# Patient Record
Sex: Male | Born: 1965 | Race: White | Hispanic: No | Marital: Married | Smoking: Never smoker
Health system: Southern US, Community
[De-identification: ages and names within clinical notes are randomized; demographics above are authoritative.]

## PROBLEM LIST (undated history)

## (undated) DIAGNOSIS — K219 Gastro-esophageal reflux disease without esophagitis: Secondary | ICD-10-CM

---

## 2016-04-20 ENCOUNTER — Other Ambulatory Visit (INDEPENDENT_AMBULATORY_CARE_PROVIDER_SITE_OTHER): Payer: Self-pay | Admitting: Physician Assistant

## 2016-04-27 NOTE — Pre-Procedure Instructions (Addendum)
    [facility] Munras Aveebert  04/27/2016      [facility] - [place] - [id number] [place] [id number] Phone: [phone] Fax: [phone]    Your procedure is scheduled on Tuesday, November 21.  [facility] Ad[facility] at 10:30 AM   Call this number if you have problems the morning of surgery: [phone]   Remember:  Do not eat food or drink liquids after midnight Monday, November 20.  Take these medicines the morning of surgery with A SIP OF WATER: None              1 Week prior to surgery STOP taking Aspirin, Aspirin Products (Goody Powder, Excedrin Migraine), Ibuprofen (Advil), Naproxen (Aleve), Vitamins and Herbal Products (ie Fish Oil)   Do not wear jewelry.  Do not wear lotions, powders, or perfumes, or deodorant.   Men may shave face and neck.  Do not bring valuables to the hospital.  [facility] is not responsible for any belongings or valuables.  Contacts, dentures or bridgework may not be worn into surgery.  Leave your suitcase in the car.  After surgery it may be brought to your room.  For patients admitted to the hospital, discharge time will be determined by your treatment team.  Patients discharged the day of surgery will not be allowed to drive home.   Special instructions: Review  [facility] - Preparing For Surgery.  Please read over the following fact sheets that you were given: [facility]- Preparing For Surgery and Patient Instructions for Mupirocin Application, Incentive Spirometry, Pain Booklet

## 2016-04-28 ENCOUNTER — Encounter (HOSPITAL_COMMUNITY): Payer: Self-pay

## 2016-04-28 ENCOUNTER — Encounter (HOSPITAL_COMMUNITY)
Admission: RE | Admit: 2016-04-28 | Discharge: 2016-04-28 | Disposition: A | Discharge status: Home / Self Care | Payer: BLUE CROSS/BLUE SHIELD | Source: Ambulatory Visit | Attending: Orthopaedic Surgery | Admitting: Orthopaedic Surgery

## 2016-04-28 DIAGNOSIS — Z01812 Encounter for preprocedural laboratory examination: Secondary | ICD-10-CM | POA: Diagnosis not present

## 2016-04-28 DIAGNOSIS — M1611 Unilateral primary osteoarthritis, right hip: Secondary | ICD-10-CM | POA: Insufficient documentation

## 2016-04-28 HISTORY — DX: Gastro-esophageal reflux disease without esophagitis: K21.9

## 2016-04-28 LAB — CBC
HCT: 43.9 % (ref 39.0–52.0)
HEMOGLOBIN: 14.8 g/dL (ref 13.0–17.0)
MCH: 30.6 pg (ref 26.0–34.0)
MCHC: 33.7 g/dL (ref 30.0–36.0)
MCV: 90.7 fL (ref 78.0–100.0)
Platelets: 223 10*3/uL (ref 150–400)
RBC: 4.84 MIL/uL (ref 4.22–5.81)
RDW: 12.2 % (ref 11.5–15.5)
WBC: 5.5 10*3/uL (ref 4.0–10.5)

## 2016-04-28 LAB — SURGICAL PCR SCREEN
MRSA, PCR: NEGATIVE
Staphylococcus aureus: POSITIVE — AB

## 2016-04-28 NOTE — Progress Notes (Addendum)
PCP: Dr. Susa LofflerMark Spears in [place], [place]  Pt. Notified of positive PCR. Prescription called to [facility].

## 2016-05-04 MED ORDER — CLINDAMYCIN PHOSPHATE 900 MG/50ML IV SOLN
900.0000 mg | INTRAVENOUS | Status: AC
  Administered 2016-05-05: 900 mg via INTRAVENOUS
  Filled 2016-05-04: qty 50

## 2016-05-04 MED ORDER — TRANEXAMIC ACID 1000 MG/10ML IV SOLN
1000.0000 mg | INTRAVENOUS | Status: AC
  Administered 2016-05-05: 1000 mg via INTRAVENOUS
  Filled 2016-05-04: qty 10

## 2016-05-05 ENCOUNTER — Encounter (HOSPITAL_COMMUNITY): Payer: Self-pay | Admitting: Surgery

## 2016-05-05 ENCOUNTER — Inpatient Hospital Stay (HOSPITAL_COMMUNITY): Payer: BLUE CROSS/BLUE SHIELD | Admitting: Anesthesiology

## 2016-05-05 ENCOUNTER — Inpatient Hospital Stay (HOSPITAL_COMMUNITY)
Admission: RE | Admit: 2016-05-05 | Discharge: 2016-05-07 | DRG: 470 | Disposition: A | Discharge status: Home Health | Payer: BLUE CROSS/BLUE SHIELD | Source: Ambulatory Visit | Attending: Orthopaedic Surgery | Admitting: Orthopaedic Surgery

## 2016-05-05 ENCOUNTER — Inpatient Hospital Stay (HOSPITAL_COMMUNITY): Payer: BLUE CROSS/BLUE SHIELD

## 2016-05-05 ENCOUNTER — Encounter (HOSPITAL_COMMUNITY)
Admission: RE | Disposition: A | Discharge status: Home / Self Care | Payer: Self-pay | Source: Ambulatory Visit | Attending: Orthopaedic Surgery

## 2016-05-05 DIAGNOSIS — M1611 Unilateral primary osteoarthritis, right hip: Secondary | ICD-10-CM | POA: Diagnosis not present

## 2016-05-05 DIAGNOSIS — Z96641 Presence of right artificial hip joint: Secondary | ICD-10-CM

## 2016-05-05 DIAGNOSIS — Z419 Encounter for procedure for purposes other than remedying health state, unspecified: Secondary | ICD-10-CM

## 2016-05-05 DIAGNOSIS — Z880 Allergy status to penicillin: Secondary | ICD-10-CM

## 2016-05-05 HISTORY — PX: TOTAL HIP ARTHROPLASTY: SHX124

## 2016-05-05 SURGERY — ARTHROPLASTY, HIP, TOTAL, ANTERIOR APPROACH
Anesthesia: Spinal | Site: Hip | Laterality: Right

## 2016-05-05 MED ORDER — DIPHENHYDRAMINE HCL 12.5 MG/5ML PO ELIX: 12.5000 mg | ORAL_SOLUTION | ORAL | Status: DC | PRN

## 2016-05-05 MED ORDER — LACTATED RINGERS IV SOLN
INTRAVENOUS | Status: DC | PRN
  Administered 2016-05-05 (×2): via INTRAVENOUS

## 2016-05-05 MED ORDER — ONDANSETRON HCL 4 MG PO TABS: 4.0000 mg | ORAL_TABLET | Freq: Four times a day (QID) | ORAL | Status: DC | PRN

## 2016-05-05 MED ORDER — ACETAMINOPHEN 650 MG RE SUPP: 650.0000 mg | Freq: Four times a day (QID) | RECTAL | Status: DC | PRN

## 2016-05-05 MED ORDER — HYDROMORPHONE HCL 1 MG/ML IJ SOLN
INTRAMUSCULAR | Status: AC
  Filled 2016-05-05: qty 0.5

## 2016-05-05 MED ORDER — 0.9 % SODIUM CHLORIDE (POUR BTL) OPTIME
TOPICAL | Status: DC | PRN
  Administered 2016-05-05: 1000 mL

## 2016-05-05 MED ORDER — HYDROMORPHONE HCL 2 MG/ML IJ SOLN: 1.0000 mg | INTRAMUSCULAR | Status: DC | PRN

## 2016-05-05 MED ORDER — ASPIRIN 81 MG PO CHEW
81.0000 mg | CHEWABLE_TABLET | Freq: Two times a day (BID) | ORAL | Status: DC
  Administered 2016-05-06 – 2016-05-07 (×3): 81 mg via ORAL
  Filled 2016-05-05 (×4): qty 1

## 2016-05-05 MED ORDER — LACTATED RINGERS IV SOLN
INTRAVENOUS | Status: DC
  Administered 2016-05-05: 11:00:00 via INTRAVENOUS

## 2016-05-05 MED ORDER — OXYCODONE HCL 5 MG/5ML PO SOLN: 5.0000 mg | Freq: Once | ORAL | Status: DC | PRN

## 2016-05-05 MED ORDER — HYDROMORPHONE HCL 1 MG/ML IJ SOLN: 0.2500 mg | INTRAMUSCULAR | Status: DC | PRN

## 2016-05-05 MED ORDER — FENTANYL CITRATE (PF) 100 MCG/2ML IJ SOLN
INTRAMUSCULAR | Status: DC | PRN
  Administered 2016-05-05: 100 ug via INTRAVENOUS

## 2016-05-05 MED ORDER — PROPOFOL 10 MG/ML IV BOLUS
INTRAVENOUS | Status: AC
Start: 2016-05-05 — End: 2016-05-05
  Filled 2016-05-05: qty 20

## 2016-05-05 MED ORDER — POLYETHYLENE GLYCOL 3350 17 G PO PACK: 17.0000 g | PACK | Freq: Every day | ORAL | Status: DC | PRN

## 2016-05-05 MED ORDER — DOCUSATE SODIUM 100 MG PO CAPS
100.0000 mg | ORAL_CAPSULE | Freq: Two times a day (BID) | ORAL | Status: DC
  Administered 2016-05-05 – 2016-05-07 (×4): 100 mg via ORAL
  Filled 2016-05-05 (×4): qty 1

## 2016-05-05 MED ORDER — METHOCARBAMOL 500 MG PO TABS
500.0000 mg | ORAL_TABLET | Freq: Four times a day (QID) | ORAL | Status: DC | PRN
  Administered 2016-05-05 – 2016-05-07 (×6): 500 mg via ORAL
  Filled 2016-05-05 (×6): qty 1

## 2016-05-05 MED ORDER — ALUM & MAG HYDROXIDE-SIMETH 200-200-20 MG/5ML PO SUSP: 30.0000 mL | ORAL | Status: DC | PRN

## 2016-05-05 MED ORDER — METHOCARBAMOL 1000 MG/10ML IJ SOLN: 500.0000 mg | Freq: Four times a day (QID) | INTRAVENOUS | Status: DC | PRN

## 2016-05-05 MED ORDER — PROPOFOL 500 MG/50ML IV EMUL
INTRAVENOUS | Status: DC | PRN
  Administered 2016-05-05: 100 ug/kg/min via INTRAVENOUS

## 2016-05-05 MED ORDER — OXYCODONE HCL 5 MG PO TABS: 5.0000 mg | ORAL_TABLET | Freq: Once | ORAL | Status: DC | PRN

## 2016-05-05 MED ORDER — ZOLPIDEM TARTRATE 5 MG PO TABS: 5.0000 mg | ORAL_TABLET | Freq: Every evening | ORAL | Status: DC | PRN

## 2016-05-05 MED ORDER — PHENOL 1.4 % MT LIQD: 1.0000 | OROMUCOSAL | Status: DC | PRN

## 2016-05-05 MED ORDER — GABAPENTIN 100 MG PO CAPS
100.0000 mg | ORAL_CAPSULE | Freq: Three times a day (TID) | ORAL | Status: DC
  Administered 2016-05-05 – 2016-05-07 (×5): 100 mg via ORAL
  Filled 2016-05-05 (×5): qty 1

## 2016-05-05 MED ORDER — CLINDAMYCIN PHOSPHATE 600 MG/50ML IV SOLN
600.0000 mg | Freq: Four times a day (QID) | INTRAVENOUS | Status: AC
  Administered 2016-05-05 – 2016-05-06 (×2): 600 mg via INTRAVENOUS
  Filled 2016-05-05 (×2): qty 50

## 2016-05-05 MED ORDER — ALBUMIN HUMAN 5 % IV SOLN
INTRAVENOUS | Status: DC | PRN
  Administered 2016-05-05: 16:00:00 via INTRAVENOUS

## 2016-05-05 MED ORDER — METOCLOPRAMIDE HCL 5 MG PO TABS: 5.0000 mg | ORAL_TABLET | Freq: Three times a day (TID) | ORAL | Status: DC | PRN

## 2016-05-05 MED ORDER — HYDROMORPHONE HCL 1 MG/ML IJ SOLN
0.2500 mg | INTRAMUSCULAR | Status: DC | PRN
  Administered 2016-05-05: 0.5 mg via INTRAVENOUS

## 2016-05-05 MED ORDER — ONDANSETRON HCL 4 MG/2ML IJ SOLN
4.0000 mg | Freq: Four times a day (QID) | INTRAMUSCULAR | Status: DC | PRN
Start: 2016-05-05 — End: 2016-05-07

## 2016-05-05 MED ORDER — PROPOFOL 500 MG/50ML IV EMUL: INTRAVENOUS | Status: DC | PRN

## 2016-05-05 MED ORDER — BUPIVACAINE HCL (PF) 0.5 % IJ SOLN
INTRAMUSCULAR | Status: DC | PRN
  Administered 2016-05-05: 3 mL via INTRATHECAL

## 2016-05-05 MED ORDER — OXYCODONE HCL 5 MG PO TABS
5.0000 mg | ORAL_TABLET | ORAL | Status: DC | PRN
  Administered 2016-05-05 – 2016-05-06 (×4): 10 mg via ORAL
  Administered 2016-05-06: 5 mg via ORAL
  Administered 2016-05-06 (×2): 10 mg via ORAL
  Administered 2016-05-07 (×3): 5 mg via ORAL
  Filled 2016-05-05 (×2): qty 1
  Filled 2016-05-05: qty 2
  Filled 2016-05-05: qty 1
  Filled 2016-05-05 (×2): qty 2
  Filled 2016-05-05: qty 1
  Filled 2016-05-05 (×2): qty 2
  Filled 2016-05-05 (×2): qty 1

## 2016-05-05 MED ORDER — CHLORHEXIDINE GLUCONATE 4 % EX LIQD: 60.0000 mL | Freq: Once | CUTANEOUS | Status: DC

## 2016-05-05 MED ORDER — ACETAMINOPHEN 325 MG PO TABS: 650.0000 mg | ORAL_TABLET | Freq: Four times a day (QID) | ORAL | Status: DC | PRN

## 2016-05-05 MED ORDER — SODIUM CHLORIDE 0.9 % IR SOLN
Status: DC | PRN
  Administered 2016-05-05: 3000 mL

## 2016-05-05 MED ORDER — SODIUM CHLORIDE 0.9 % IV SOLN
INTRAVENOUS | Status: DC
  Administered 2016-05-05: 21:00:00 via INTRAVENOUS

## 2016-05-05 MED ORDER — MIDAZOLAM HCL 2 MG/2ML IJ SOLN
INTRAMUSCULAR | Status: AC
  Filled 2016-05-05: qty 2

## 2016-05-05 MED ORDER — FENTANYL CITRATE (PF) 100 MCG/2ML IJ SOLN
INTRAMUSCULAR | Status: AC
  Filled 2016-05-05: qty 4

## 2016-05-05 MED ORDER — PHENYLEPHRINE HCL 10 MG/ML IJ SOLN
INTRAMUSCULAR | Status: DC | PRN
  Administered 2016-05-05 (×4): 80 ug via INTRAVENOUS

## 2016-05-05 MED ORDER — METOCLOPRAMIDE HCL 5 MG/ML IJ SOLN: 5.0000 mg | Freq: Three times a day (TID) | INTRAMUSCULAR | Status: DC | PRN

## 2016-05-05 MED ORDER — MIDAZOLAM HCL 2 MG/2ML IJ SOLN
INTRAMUSCULAR | Status: DC | PRN
  Administered 2016-05-05: 2 mg via INTRAVENOUS

## 2016-05-05 MED ORDER — PROMETHAZINE HCL 25 MG/ML IJ SOLN: 6.2500 mg | INTRAMUSCULAR | Status: DC | PRN

## 2016-05-05 MED ORDER — MENTHOL 3 MG MT LOZG: 1.0000 | LOZENGE | OROMUCOSAL | Status: DC | PRN

## 2016-05-05 SURGICAL SUPPLY — 55 items
BENZOIN TINCTURE PRP APPL 2/3 (GAUZE/BANDAGES/DRESSINGS) ×3 IMPLANT
BLADE SAW SGTL 18X1.27X75 (BLADE) ×2 IMPLANT
BLADE SAW SGTL 18X1.27X75MM (BLADE) ×1
BLADE SURG ROTATE 9660 (MISCELLANEOUS) IMPLANT
CAPT HIP TOTAL 2 ×3 IMPLANT
CELLS DAT CNTRL 66122 CELL SVR (MISCELLANEOUS) ×1 IMPLANT
CLOSURE STERI-STRIP 1/2X4 (GAUZE/BANDAGES/DRESSINGS) ×1
CLOSURE WOUND 1/2 X4 (GAUZE/BANDAGES/DRESSINGS) ×2
CLSR STERI-STRIP ANTIMIC 1/2X4 (GAUZE/BANDAGES/DRESSINGS) ×2 IMPLANT
COVER SURGICAL LIGHT HANDLE (MISCELLANEOUS) ×3 IMPLANT
DRAPE C-ARM 42X72 X-RAY (DRAPES) ×3 IMPLANT
DRAPE STERI IOBAN 125X83 (DRAPES) ×3 IMPLANT
DRAPE U-SHAPE 47X51 STRL (DRAPES) ×9 IMPLANT
DRSG AQUACEL AG ADV 3.5X10 (GAUZE/BANDAGES/DRESSINGS) ×3 IMPLANT
DURAPREP 26ML APPLICATOR (WOUND CARE) ×3 IMPLANT
ELECT BLADE 4.0 EZ CLEAN MEGAD (MISCELLANEOUS) ×3
ELECT BLADE 6.5 EXT (BLADE) IMPLANT
ELECT REM PT RETURN 9FT ADLT (ELECTROSURGICAL) ×3
ELECTRODE BLDE 4.0 EZ CLN MEGD (MISCELLANEOUS) ×1 IMPLANT
ELECTRODE REM PT RTRN 9FT ADLT (ELECTROSURGICAL) ×1 IMPLANT
FACESHIELD WRAPAROUND (MASK) ×6 IMPLANT
GLOVE BIOGEL PI IND STRL 8 (GLOVE) ×2 IMPLANT
GLOVE BIOGEL PI INDICATOR 8 (GLOVE) ×4
GLOVE ECLIPSE 8.0 STRL XLNG CF (GLOVE) ×3 IMPLANT
GLOVE ORTHO TXT STRL SZ7.5 (GLOVE) ×6 IMPLANT
GOWN STRL REUS W/ TWL LRG LVL3 (GOWN DISPOSABLE) ×2 IMPLANT
GOWN STRL REUS W/ TWL XL LVL3 (GOWN DISPOSABLE) ×2 IMPLANT
GOWN STRL REUS W/TWL LRG LVL3 (GOWN DISPOSABLE) ×4
GOWN STRL REUS W/TWL XL LVL3 (GOWN DISPOSABLE) ×4
HANDPIECE INTERPULSE COAX TIP (DISPOSABLE) ×2
HEAD CERAMIC 36 PLUS5 (Hips) IMPLANT
HEAD CERAMIC DELTA 36 PLUS 1.5 (Hips) IMPLANT
KIT BASIN OR (CUSTOM PROCEDURE TRAY) ×3 IMPLANT
KIT ROOM TURNOVER OR (KITS) ×3 IMPLANT
MANIFOLD NEPTUNE II (INSTRUMENTS) ×3 IMPLANT
NS IRRIG 1000ML POUR BTL (IV SOLUTION) ×3 IMPLANT
PACK TOTAL JOINT (CUSTOM PROCEDURE TRAY) ×3 IMPLANT
PAD ARMBOARD 7.5X6 YLW CONV (MISCELLANEOUS) ×3 IMPLANT
RTRCTR WOUND ALEXIS 18CM MED (MISCELLANEOUS) ×3
SET HNDPC FAN SPRY TIP SCT (DISPOSABLE) ×1 IMPLANT
STAPLER VISISTAT 35W (STAPLE) IMPLANT
STRIP CLOSURE SKIN 1/2X4 (GAUZE/BANDAGES/DRESSINGS) ×4 IMPLANT
SUT ETHIBOND NAB CT1 #1 30IN (SUTURE) ×3 IMPLANT
SUT MNCRL AB 4-0 PS2 18 (SUTURE) IMPLANT
SUT VIC AB 0 CT1 27 (SUTURE) ×2
SUT VIC AB 0 CT1 27XBRD ANBCTR (SUTURE) ×1 IMPLANT
SUT VIC AB 1 CT1 27 (SUTURE) ×2
SUT VIC AB 1 CT1 27XBRD ANBCTR (SUTURE) ×1 IMPLANT
SUT VIC AB 2-0 CT1 27 (SUTURE) ×2
SUT VIC AB 2-0 CT1 TAPERPNT 27 (SUTURE) ×1 IMPLANT
TOWEL OR 17X24 6PK STRL BLUE (TOWEL DISPOSABLE) ×3 IMPLANT
TOWEL OR 17X26 10 PK STRL BLUE (TOWEL DISPOSABLE) ×3 IMPLANT
TRAY CATH 16FR W/PLASTIC CATH (SET/KITS/TRAYS/PACK) ×3 IMPLANT
TRAY FOLEY CATH 16FRSI W/METER (SET/KITS/TRAYS/PACK) IMPLANT
WATER STERILE IRR 1000ML POUR (IV SOLUTION) ×6 IMPLANT

## 2016-05-05 NOTE — Brief Op Note (Signed)
05/05/2016  3:33 PM  PATIENT:  Marisa Cyphersichard Darrough  50 y.o. male  PRE-OPERATIVE DIAGNOSIS:  Severe osteoarthritis right hip  POST-OPERATIVE DIAGNOSIS:  Severe osteoarthritis right hip  PROCEDURE:  Procedure(s): RIGHT TOTAL HIP ARTHROPLASTY ANTERIOR APPROACH (Right)  SURGEON:  Surgeon(s) and Role:    * Kathryne Hitchhristopher Y Blackman, MD - Primary  PHYSICIAN ASSISTANT: Rexene EdisonGil Clark, PA-C  ANESTHESIA:   spinal  EBL:  Total I/O In: 2500 [I.V.:2500] Out: 700 [Blood:700]  COUNTS:  YES  PLAN OF CARE: Admit to inpatient   PATIENT DISPOSITION:  PACU - hemodynamically stable.   Delay start of Pharmacological VTE agent (>24hrs) due to surgical blood loss or risk of bleeding: no

## 2016-05-05 NOTE — Anesthesia Procedure Notes (Signed)
Date/Time: 05/05/2016 1:35 PM Performed by: Gwenyth AllegraADAMI, Jeanne Pre-anesthesia Checklist: Patient identified, Patient being monitored and Timeout performed Patient Re-evaluated:Patient Re-evaluated prior to inductionOxygen Delivery Method: Nasal cannula Preoxygenation: Pre-oxygenation with 100% oxygen Intubation Type: IV induction

## 2016-05-05 NOTE — Transfer of Care (Signed)
Immediate Anesthesia Transfer of Care Note  Patient: Ronald Spears  Procedure(s) Performed: Procedure(s): RIGHT TOTAL HIP ARTHROPLASTY ANTERIOR APPROACH (Right)  Patient Location: PACU  Anesthesia Type:Spinal  Level of Consciousness: awake, alert  and oriented  Airway & Oxygen Therapy: Patient Spontanous Breathing and Patient connected to nasal cannula oxygen  Post-op Assessment: Report given to RN and Post -op Vital signs reviewed and stable  Post vital signs: Reviewed and stable  Last Vitals:  Vitals:   05/05/16 1010  BP: 135/80  Pulse: 74  Resp: 20  Temp: 36.7 C    Last Pain:  Vitals:   05/05/16 1010  TempSrc: Oral      Patients Stated Pain Goal: 7 (05/05/16 1019)  Complications: No apparent anesthesia complications

## 2016-05-05 NOTE — Anesthesia Postprocedure Evaluation (Signed)
Anesthesia Post Note  Patient: Ronald Spears  Procedure(s) Performed: Procedure(s) (LRB): RIGHT TOTAL HIP ARTHROPLASTY ANTERIOR APPROACH (Right)  Patient location during evaluation: PACU Anesthesia Type: Spinal and MAC Level of consciousness: awake and alert Pain management: pain level controlled Vital Signs Assessment: post-procedure vital signs reviewed and stable Respiratory status: spontaneous breathing and respiratory function stable Cardiovascular status: blood pressure returned to baseline and stable Postop Assessment: spinal receding Anesthetic complications: no    Last Vitals:  Vitals:   05/05/16 1921 05/05/16 1930  BP: 97/60   Pulse: 71   Resp: 13   Temp:  36.6 C    Last Pain:  Vitals:   05/05/16 1010  TempSrc: Oral    LLE Motor Response: Purposeful movement (05/05/16 1930)   RLE Motor Response: Purposeful movement (05/05/16 1930)   L Sensory Level: S1-Sole of foot, small toes (05/05/16 1930) R Sensory Level: S1-Sole of foot, small toes (05/05/16 1930)  Tzipora Mcinroy,W. EDMOND

## 2016-05-05 NOTE — OR Nursing (Signed)
1550: in&uot cath=150cc cyu, per protocol, no trauma.

## 2016-05-05 NOTE — Anesthesia Procedure Notes (Signed)
Spinal  Patient location during procedure: OR Staffing Anesthesiologist: Shyrl Obi Preanesthetic Checklist Completed: patient identified, surgical consent, pre-op evaluation, timeout performed, IV checked, risks and benefits discussed and monitors and equipment checked Spinal Block Patient position: sitting Prep: site prepped and draped and DuraPrep Patient monitoring: heart rate, cardiac monitor, continuous pulse ox and blood pressure Approach: midline Location: L3-4 Injection technique: single-shot Needle Needle type: Whitacre  Needle gauge: 25 G Needle length: 10 cm Assessment Sensory level: T6

## 2016-05-05 NOTE — Anesthesia Preprocedure Evaluation (Signed)
Anesthesia Evaluation  Patient identified by MRN, date of birth, ID band Patient awake    Reviewed: Allergy & Precautions, NPO status , Patient's Chart, lab work & pertinent test results  Airway Mallampati: I       Dental  (+) Teeth Intact   Pulmonary neg pulmonary ROS,    breath sounds clear to auscultation       Cardiovascular negative cardio ROS   Rhythm:Regular Rate:Normal     Neuro/Psych negative neurological ROS  negative psych ROS   GI/Hepatic negative GI ROS, Neg liver ROS,   Endo/Other  negative endocrine ROS  Renal/GU negative Renal ROS  negative genitourinary   Musculoskeletal negative musculoskeletal ROS (+)   Abdominal   Peds negative pediatric ROS (+)  Hematology negative hematology ROS (+)   Anesthesia Other Findings   Reproductive/Obstetrics negative OB ROS                             Anesthesia Physical Anesthesia Plan  ASA: I  Anesthesia Plan: Spinal   Post-op Pain Management:    Induction: Intravenous  Airway Management Planned: Simple Face Mask  Additional Equipment:   Intra-op Plan:   Post-operative Plan: Extubation in OR  Informed Consent: I have reviewed the patients History and Physical, chart, labs and discussed the procedure including the risks, benefits and alternatives for the proposed anesthesia with the patient or authorized representative who has indicated his/her understanding and acceptance.   Dental advisory given  Plan Discussed with: CRNA and Surgeon  Anesthesia Plan Comments:         Anesthesia Quick Evaluation

## 2016-05-05 NOTE — H&P (Signed)
TOTAL HIP ADMISSION H&P  Patient is admitted for right total hip arthroplasty.  Subjective:  Chief Complaint: right hip pain  HPI: Ronald Spears, [age] y.o. male, has a history of pain and functional disability in the right hip(s) due to arthritis and patient has failed non-surgical conservative treatments for greater than 12 weeks to include NSAID's and/or analgesics, corticosteriod injections, flexibility and strengthening excercises and activity modification.  Onset of symptoms was gradual starting 5 years ago with gradually worsening course since that time.The patient noted no past surgery on the right hip(s).  Patient currently rates pain in the right hip at 10 out of 10 with activity. Patient has night pain, worsening of pain with activity and weight bearing, trendelenberg gait, pain that interfers with activities of daily living, pain with passive range of motion and crepitus. Patient has evidence of subchondral sclerosis, periarticular osteophytes and joint space narrowing by imaging studies. This condition presents safety issues increasing the risk of falls.  There is no current active infection.  Patient Active Problem List   Diagnosis Date Noted  . Unilateral primary osteoarthritis, right hip 05/05/2016   Past Medical History:  Diagnosis Date  . GERD (gastroesophageal reflux disease)    ocassionally    History reviewed. No pertinent surgical history.  Prescriptions Prior to Admission  Medication Sig Dispense Refill Last Dose  . ibuprofen (ADVIL,MOTRIN) 200 MG tablet Take 200-400 mg by mouth every 8 (eight) hours as needed (for pain.).   Past Month at Unknown time  . Multiple Vitamins-Minerals (EMERGEN-C IMMUNE PLUS) PACK Take 1 packet by mouth daily as needed (for immune health/cold symptoms.).   Past Week at Unknown time  . OVER THE COUNTER MEDICATION Take 1 capsule by mouth daily as needed (before exercise regime). Pre-workout supplements   Past Week at Unknown time  . PROTEIN PO  Take 1 scoop by mouth 2 (two) times daily as needed (following exercise regime). CARNIVOR pure Beef Protein Isolate   Past Week at Unknown time   Allergies  Allergen Reactions  . Penicillins     UNSPECIFIED REACTION   Has patient had a PCN reaction causing immediate rash, facial/tongue/throat swelling, SOB or lightheadedness with hypotension:  UNSURE Has patient had a PCN reaction causing severe rash involving mucus membranes or skin necrosis:UNSURE Has patient had a PC N reaction that required hospitalization:  UNSURE  Has patient had a PCN reaction occurring within the last 10 years:UNSURE If all of the above answers are "NO", then may proceed with Cephalosporin use.     Social History  Substance Use Topics  . Smoking status: Never Smoker  . Smokeless tobacco: Never Used  . Alcohol use No    History reviewed. No pertinent family history.   Review of Systems  Musculoskeletal: Positive for joint pain.  All other systems reviewed and are negative.   Objective:  Physical Exam  Constitutional: He is oriented to person, place, and time. He appears well-developed and well-nourished.  HENT:  Head: Normocephalic and atraumatic.  Eyes: EOM are normal. Pupils are equal, round, and reactive to light.  Neck: Normal range of motion. Neck supple.  Cardiovascular: Normal rate and regular rhythm.   Respiratory: Effort normal and breath sounds normal.  GI: Soft. Bowel sounds are normal.  Musculoskeletal:       Right hip: He exhibits decreased range of motion, decreased strength, tenderness and bony tenderness.  Neurological: He is alert and oriented to person, place, and time.  Skin: Skin is warm and dry.  Psychiatric:  He has a normal mood and affect.    Vital signs in last 24 hours: Temp:  [98.1 F (36.7 C)] 98.1 F (36.7 C) (11/21 1010) Pulse Rate:  [74] 74 (11/21 1010) Resp:  [20] 20 (11/21 1010) BP: (135)/(80) 135/80 (11/21 1010) SpO2:  [100 %] 100 % (11/21 1010) Weight:   [178 lb (80.7 kg)] 178 lb (80.7 kg) (11/21 1010)  Labs:   Estimated body mass index is 24.14 kg/m as calculated from the following:   Height as of this encounter: 6' (1.829 m).   Weight as of this encounter: 178 lb (80.7 kg).   Imaging Review Plain radiographs demonstrate severe degenerative joint disease of the right hip(s). The bone quality appears to be excellent for age and reported activity level.  Assessment/Plan:  End stage arthritis, right hip(s)  The patient history, physical examination, clinical judgement of the provider and imaging studies are consistent with end stage degenerative joint disease of the right hip(s) and total hip arthroplasty is deemed medically necessary. The treatment options including medical management, injection therapy, arthroscopy and arthroplasty were discussed at length. The risks and benefits of total hip arthroplasty were presented and reviewed. The risks due to aseptic loosening, infection, stiffness, dislocation/subluxation,  thromboembolic complications and other imponderables were discussed.  The patient acknowledged the explanation, agreed to proceed with the plan and consent was signed. Patient is being admitted for inpatient treatment for surgery, pain control, PT, OT, prophylactic antibiotics, VTE prophylaxis, progressive ambulation and ADL's and discharge planning.The patient is planning to be discharged home with home health services

## 2016-05-06 ENCOUNTER — Encounter (HOSPITAL_COMMUNITY): Payer: Self-pay | Admitting: Orthopaedic Surgery

## 2016-05-06 LAB — BASIC METABOLIC PANEL
Anion gap: 5 (ref 5–15)
BUN: 9 mg/dL (ref 6–20)
CHLORIDE: 102 mmol/L (ref 101–111)
CO2: 29 mmol/L (ref 22–32)
CREATININE: 0.9 mg/dL (ref 0.61–1.24)
Calcium: 8.2 mg/dL — ABNORMAL LOW (ref 8.9–10.3)
GFR calc Af Amer: >60 mL/min (ref >60)
GFR calc non Af Amer: >60 mL/min (ref >60)
GLUCOSE: 127 mg/dL — AB (ref 65–99)
Potassium: 3.8 mmol/L (ref 3.5–5.1)
Sodium: 136 mmol/L (ref 135–145)

## 2016-05-06 LAB — CBC
HEMATOCRIT: 27.9 % — AB (ref 39.0–52.0)
HEMOGLOBIN: 9.3 g/dL — AB (ref 13.0–17.0)
MCH: 30.2 pg (ref 26.0–34.0)
MCHC: 33.3 g/dL (ref 30.0–36.0)
MCV: 90.6 fL (ref 78.0–100.0)
Platelets: 192 10*3/uL (ref 150–400)
RBC: 3.08 MIL/uL — ABNORMAL LOW (ref 4.22–5.81)
RDW: 12.2 % (ref 11.5–15.5)
WBC: 8.6 10*3/uL (ref 4.0–10.5)

## 2016-05-06 NOTE — Progress Notes (Signed)
Physical Therapy Treatment Patient Details Name: Ronald CyphersRichard Louque MRN: [id number] DOB: February 24, 1966 Today's Date: 05/06/2016    History of Present Illness 50 y.o. male [facility] for elective R direct anterior THA.  Pt with no significant PMHx .      PT Comments    Patient limited by pain however willing to participate in therapy session. Min guard/supervision for mobility.  Continue to progress as tolerated with anticipated d/c home with HHPT.   Follow Up Recommendations  Home health PT;Supervision for mobility/OOB     Equipment Recommendations  3in1 (PT)    Recommendations for Other Services       Precautions / Restrictions Precautions Precautions: Fall Restrictions Weight Bearing Restrictions: Yes RLE Weight Bearing: Weight bearing as tolerated    Mobility  Bed Mobility Overal bed mobility: Needs Assistance Bed Mobility: Supine to Sit     Supine to sit: Min guard     General bed mobility comments: cues for sequencing and technique with pt using L LE to bring R LE to EOB  Transfers Overall transfer level: Needs assistance Equipment used: Rolling walker (2 wheeled) Transfers: Sit to/from Stand Sit to Stand: Min guard         General transfer comment: min guard for safety; carry over of safe hand placement  Ambulation/Gait Ambulation/Gait assistance: Min guard Ambulation Distance (Feet): 130 Feet Assistive device: Rolling walker (2 wheeled) Gait Pattern/deviations: Step-through pattern;Antalgic Gait velocity: decreased Gait velocity interpretation: Below normal speed for age/gender General Gait Details: slow, steady gait; mildly antalgic gait; cues for posture/forward gaze   Stairs            Wheelchair Mobility    Modified Rankin (Stroke Patients Only)       Balance Overall balance assessment: Needs assistance Sitting-balance support: Feet supported;No upper extremity supported Sitting balance-Leahy Scale: Good      Standing balance support: Bilateral upper extremity supported;No upper extremity supported;Single extremity supported Standing balance-Leahy Scale: Fair                      Cognition Arousal/Alertness: Awake/alert Behavior During Therapy: WFL for tasks assessed/performed Overall Cognitive Status: Within Functional Limits for tasks assessed                      Exercises Total Joint Exercises Ankle Circles/Pumps: AROM;Both;20 reps Quad Sets: AROM;Both;10 reps Heel Slides: AAROM;10 reps;Right Knee Flexion: AROM;Right;10 reps;Standing    General Comments        Pertinent Vitals/Pain Pain Assessment: 0-10 Pain Score: 8  Pain Location: R hip and groin  Pain Descriptors / Indicators: Aching;Guarding;Grimacing;Sore Pain Intervention(s): Limited activity within patient's tolerance;Monitored during session;Premedicated before session;Repositioned;Patient requesting pain meds-RN notified;Ice applied    Home Living Family/patient expects to be discharged to:: Private residence Living Arrangements: Spouse/significant other Available Help at Discharge: Family;Available 24 hours/day Type of Home: House Home Access: Stairs to enter Entrance Stairs-Rails: None Home Layout: Two level;Able to live on main level with bedroom/bathroom Home Equipment: None      Prior Function Level of Independence: Independent      Comments: drives/ martial arts   PT Goals (current goals can now be found in the care plan section) Acute Rehab PT Goals Patient Stated Goal: Back to being independent and return to martial arts PT Goal Formulation: With patient Time For Goal Achievement: 05/13/16 Potential to Achieve Goals: Good Progress towards PT goals: Progressing toward goals    Frequency    7X/week  PT Plan Current plan remains appropriate    Co-evaluation             End of Session Equipment Utilized During Treatment: Gait belt Activity Tolerance: Patient  limited by pain Patient left: in chair;with call bell/phone within reach     Time: 1515-1540 PT Time Calculation (min) (ACUTE ONLY): 25 min  Charges:  $Gait Training: 8-22 mins $Therapeutic Exercise: 8-22 mins                    G Codes:      Derek MoundKellyn R Shailen Thielen Azalya Galyon, PTA Pager: [phone]   05/06/2016, 3:46 PM

## 2016-05-06 NOTE — Evaluation (Signed)
Physical Therapy Evaluation Patient Details Name: Ronald CyphersRichard Spears MRN: [id number] DOB: [birth year] Today's Date: 05/06/2016   History of Present Illness  50 y.o. male [facility] for elective R direct anterior THA.  Pt with no significant PMHx .    Clinical Impression  Pt is POD #1 and is moving well, min guard assist overall with RW a good distance into the hallway.  Pt will likely progress well enough to d/c home with HHPT and family's assist at discharge.   PT to follow acutely for deficits listed below.       Follow Up Recommendations Home health PT;Supervision for mobility/OOB    Equipment Recommendations  3in1 (PT)    Recommendations for Other Services   NA    Precautions / Restrictions Precautions Precautions: Fall Restrictions Weight Bearing Restrictions: Yes RLE Weight Bearing: Weight bearing as tolerated      Mobility  Bed Mobility               General bed mobility comments: Pt is OOB in recliner chair.   Transfers Overall transfer level: Needs assistance Equipment used: Rolling walker (2 wheeled) Transfers: Sit to/from Stand Sit to Stand: Min assist         General transfer comment: Min assist to support trunk during transitions in and out of low recliner chair.  Verbal cues for safe hand placement and speed.   Ambulation/Gait Ambulation/Gait assistance: Min guard Ambulation Distance (Feet): 130 Feet Assistive device: Rolling walker (2 wheeled) Gait Pattern/deviations: Step-through pattern;Antalgic Gait velocity: decreased Gait velocity interpretation: Below normal speed for age/gender General Gait Details: Pt trying to see how much weight his pain will allow him to put through his right leg, however, when he lighthens his grip, his gait pattern is signficiantly more antalgic and min guard assist needed to ensure he didn't buckle.          Balance Overall balance assessment: Needs assistance Sitting-balance support: Feet  supported;No upper extremity supported Sitting balance-Leahy Scale: Good     Standing balance support: Bilateral upper extremity supported;No upper extremity supported;Single extremity supported Standing balance-Leahy Scale: Fair                               Pertinent Vitals/Pain Pain Assessment: 0-10 Pain Score: 5  Pain Location: right hip with mobility Pain Descriptors / Indicators: Aching Pain Intervention(s): Limited activity within patient's tolerance;Monitored during session;Repositioned    Home Living Family/patient expects to be discharged to:: Private residence Living Arrangements: Spouse/significant other Available Help at Discharge: Family;Available 24 hours/day Type of Home: House Home Access: Stairs to enter Entrance Stairs-Rails: None Entrance Stairs-Number of Steps: 3 Home Layout: Two level;Able to live on main level with bedroom/bathroom Home Equipment: None      Prior Function Level of Independence: Independent         Comments: drives/ martial arts     Hand Dominance   Dominant Hand: Right    Extremity/Trunk Assessment   Upper Extremity Assessment: Defer to OT evaluation           Lower Extremity Assessment: RLE deficits/detail RLE Deficits / Details: right leg with normal post op pain and weakness, ankle at lest 3/5, knee 3-/5, hip 2/5 per gross functional assessment.     Cervical / Trunk Assessment: Normal  Communication   Communication: No difficulties  Cognition Arousal/Alertness: Awake/alert Behavior During Therapy: WFL for tasks assessed/performed Overall Cognitive Status: Within Functional Limits for tasks  assessed                         Exercises Total Joint Exercises Ankle Circles/Pumps: AROM;Both;20 reps Quad Sets: AROM;Both;10 reps Heel Slides: AAROM;Left;10 reps   Assessment/Plan    PT Assessment Patient needs continued PT services  PT Problem List Decreased strength;Decreased range of  motion;Decreased activity tolerance;Decreased balance;Decreased mobility;Decreased knowledge of use of DME;Pain          PT Treatment Interventions DME instruction;Gait training;Stair training;Functional mobility training;Therapeutic activities;Therapeutic exercise;Balance training;Patient/family education;Manual techniques;Modalities    PT Goals (Current goals can be found in the Care Plan section)  Acute Rehab PT Goals Patient Stated Goal: Back to being independent and return to martial arts PT Goal Formulation: With patient Time For Goal Achievement: 05/13/16 Potential to Achieve Goals: Good    Frequency 7X/week           End of Session Equipment Utilized During Treatment: Gait belt Activity Tolerance: Patient limited by pain Patient left: in chair;with call bell/phone within reach;with family/visitor present           Time: [id number] PT Time Calculation (min) (ACUTE ONLY): 18 min   Charges:   PT Evaluation $PT Eval Moderate Complexity: 1 Procedure          Lee Kalt B. Roya Gieselman, PT, DPT [phone]   05/06/2016, 1:06 PM

## 2016-05-06 NOTE — Progress Notes (Signed)
Subjective: 1 Day Post-Op Procedure(s) (LRB): RIGHT TOTAL HIP ARTHROPLASTY ANTERIOR APPROACH (Right) Patient reports pain as moderate.    Objective: Vital signs in last 24 hours: Temp:  [97 F (36.1 C)-98.1 F (36.7 C)] 97.7 F (36.5 C) (11/22 16100632) Pulse Rate:  [57-87] 87 (11/22 0632) Resp:  [8-20] 17 (11/22 [id number]) BP: (83-135)/(48-80) 118/68 (11/22 54090632) SpO2:  [98 %-100 %] 98 % (11/22 81190632) Weight:  [178 lb (80.7 kg)] 178 lb (80.7 kg) (11/21 1010)  Intake/Output from previous day: 11/21 0701 - 11/22 0700 In: 3900 [I.V.:3550; IV Piggyback:350] Out: 1275 [Urine:575; Blood:700] Intake/Output this shift: No intake/output data recorded.  No results for input(s): HGB in the last 72 hours. No results for input(s): WBC, RBC, HCT, PLT in the last 72 hours. No results for input(s): NA, K, CL, CO2, BUN, CREATININE, GLUCOSE, CALCIUM in the last 72 hours. No results for input(s): LABPT, INR in the last 72 hours.  Sensation intact distally Intact pulses distally Dorsiflexion/Plantar flexion intact Incision: dressing C/D/I  Assessment/Plan: 1 Day Post-Op Procedure(s) (LRB): RIGHT TOTAL HIP ARTHROPLASTY ANTERIOR APPROACH (Right) Up with therapy  Kathryne HitchChristopher Y Markice Torbert 05/06/2016, 7:22 AM

## 2016-05-06 NOTE — Op Note (Signed)
NAMZada Girt:  Spears, Ronald              ACCOUNT NO.:  [id number]  MEDICAL RECORD NO.:  [id number]  LOCATION:                                 FACILITY:  PHYSICIAN:  Vanita PandaChristopher Y. Magnus IvanBlackman, M.D.DATE OF BIRTH:  April 03, 1966  DATE OF PROCEDURE: DATE OF DISCHARGE:                              OPERATIVE REPORT   PREOPERATIVE DIAGNOSIS:  Primary osteoarthritis and degenerative joint disease, right hip.  POSTOPERATIVE DIAGNOSIS:  Primary osteoarthritis and degenerative joint disease, right hip.  PROCEDURE:  Right total hip arthroplasty through direct anterior approach.  IMPLANTS:  DePuy Sector Gription acetabular component size 60 with a single screw, size 36+ 4 polyethylene liner, size 13 Corail femoral component with varus offset, size 36+ 8.5 ceramic hip ball.  SURGEON:  Kathryne Hitchhristopher Y Beautiful Pensyl, M.D.  ASSISTANT:  Richardean CanalGilbert Clark, PA-C.  ANESTHESIA:  Spinal.  ANTIBIOTICS:  900 mg IV clindamycin.  BLOOD LOSS:  600 mL.  COMPLICATIONS:  None.  INDICATIONS:  Ronald BurdockRichard is a very pleasant 50 year old gentleman well known to me.  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  This has affected his activities of daily living negatively as well as his mobility and his quality of life.  At this point, he does wish to proceed with a total hip arthroplasty.  He understands the risk of acute blood loss anemia, nerve and vessel injury, fracture, infection, dislocation, DVT.  He understands our goals are decreased pain, improved mobility, and overall improved quality of life.  PROCEDURE DESCRIPTION:  After informed consent was obtained, appropriate right hip was marked.  He was brought to the operating room, where spinal anesthesia was obtained while he was on a stretcher.  A Foley catheter was placed and then both feet had traction boots applied  to them.  Next, he was placed supine on the Hana fracture table and a perineal post in place, both legs in inline skeletal traction devices but no traction applied.  His right operative hip was prepped and draped with DuraPrep and sterile drapes.  A time-out was called and he was identified as the correct patient, correct right hip.  I then made an incision just inferior and posterior to the anterior-superior iliac spine and carried this obliquely down the leg.  We dissected down the tensor fascia lata muscle and the tensor fascia was then divided longitudinally so we could proceed with our direct anterior approach to the hip.  We identified and cauterized the circumflex vessels and identified the hip capsule.  I opened the hip capsule in an L-type format finding a large joint effusion and significant periarticular osteophytes and femoral head almost devoid of cartilage.  We placed Hohmann retractors around the medial and lateral femoral neck and then made our femoral neck cut with an oscillating saw proximal to the lesser trochanter and completed this with an osteotome.  We placed a corkscrew guide in the femoral head and removed the femoral head in its entirety and found wide areas devoid of cartilage.  We then cleaned the acetabular remnants of the acetabular labrum and other  debris.  We then began reaming under direct visualization from a size 42 reamer all the way up to a size 60 due to the significant sclerosis and a large femoral head.  We were able to medialize quite significantly.  We then placed the real DePuy Sector Gription acetabular component size 60 and a single screw.  We placed a 36+ 4 polyethylene liner for that size acetabular component.  Attention was then turned to the femur.  With the leg externally rotated to 120 degrees, extended and adducted, we were able to place a Mueller retractor medially and a Hohmann retractor behind the greater trochanter.  We released the  lateral joint capsule and used a box cutting osteotome to enter the femoral canal and a rongeur to lateralize.  We then began broaching from a size 8 broach using the Corail broaching system going up to a size 13.  With the 13 in place, we trialed a varus offset femoral neck.  We went with a 36+ 1.5 hip ball. We felt that this was stable and we dislocated the hip and we went with real components with a 13 varus offset femoral neck and 36+ 1.5 ceramic hip ball.  He did definitely felt unstable on external rotation slightly short, so we removed that ball and we felt that going up to a 5 which we trialed which was the right ball to go with.  We therefore got the trial 5 in and it felt good and then we went with a real 5, it still felt slightly unstable to me on external rotation with more Shuck than I am used to.  He is someone who is going to stress the limits of his hip with his mixed martial arts and karate, so we dislocated the hip and I removed that +5 hip ball and went with a 36+ 8.5 hip ball.  I felt that this gave him much more stability and we will still continue range of motion.  We relocated the hip and irrigated the soft tissue with normal saline solution using pulsatile lavage.  We placed a #1 Ethibond suture to close the joint capsule, followed by 0 Vicryl in the deep tissue, 2-0 Vicryl in the subcutaneous tissue, 4-0 Monocryl subcuticular stitch and Steri-Strips on the skin.  An Aquacel dressing was applied.  He was taken off the Hana table and taken to the recovery room in stable condition.  All final counts were correct, and there were no complications noted.  Of note, Richardean CanalGilbert Clark, PA-C, assisted me in the entire case.  His assistance was crucial for facilitating all aspects of this case.     Vanita Pandahristopher Y. Magnus IvanBlackman, M.D.   ______________________________ Vanita Pandahristopher Y. Magnus IvanBlackman, M.D.    CYB/MEDQ  D:  05/05/2016  T:  05/06/2016  Job:  [id number]

## 2016-05-06 NOTE — Progress Notes (Signed)
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 Pt initially anxious about moving, then did well, requiring min Guard assist and mobility and min A with LB ADL. Will follow acutely to complete education on tub transfers using 3 in 1.     Follow Up Recommendations  No OT follow up;Supervision - Intermittent    Equipment Recommendations  3 in 1 bedside comode    Recommendations for Other Services       Precautions / Restrictions Precautions Precautions: Fall Restrictions Weight Bearing Restrictions: Yes RLE Weight Bearing: Weight bearing as tolerated      Mobility Bed Mobility Overal bed mobility: Needs Assistance Bed Mobility: Supine to Sit     Supine to sit: Min assist     General bed mobility comments: MIn A to advance RLE to EOB  Transfers Overall transfer level: Needs assistance Equipment used: Rolling walker (2 wheeled) Transfers: Sit to/from Stand Sit to Stand: Min assist         General transfer comment: initially not wanting to put pressure through RLE, began putting weight through RLE when ambulating    Balance Overall balance assessment: No apparent balance deficits (not formally assessed)                                          ADL Overall ADL's : Needs assistance/impaired     Grooming: Set up   Upper Body Bathing: Set up   Lower Body Bathing: Minimal assistance;Sit to/from stand   Upper Body Dressing : Set up   Lower Body Dressing: Minimal assistance;Sit to/from stand   Toilet Transfer: Minimal assistance;Ambulation (simulated)           Functional mobility during ADLs: Minimal assistance;Rolling walker;Cueing for safety;Cueing for  sequencing General ADL Comments: Pt anxious about moving and causing pain. Began education regarding compensatory techniques for ADL adn safe mobility.     Vision     Perception     Praxis      Pertinent Vitals/Pain Pain Assessment: 0-10 Pain Score: 2  Pain Location: R hiop Pain Descriptors / Indicators: Aching Pain Intervention(s): Ice applied;Limited activity within patient's tolerance     Hand Dominance Right   Extremity/Trunk Assessment Upper Extremity Assessment Upper Extremity Assessment: Overall WFL for tasks assessed   Lower Extremity Assessment Lower Extremity Assessment: Defer to PT evaluation   Cervical / Trunk Assessment Cervical / Trunk Assessment: Normal   Communication Communication Communication: No difficulties   Cognition Arousal/Alertness: Awake/alert Behavior During Therapy: WFL for tasks assessed/performed Overall Cognitive Status: Within Functional Limits for tasks assessed                     General Comments       Exercises       Shoulder Instructions      Home Living Family/patient expects to be discharged to:: Private residence Living Arrangements: Spouse/significant other Available Help at Discharge: Family;Available 24 hours/day Type of Home: House Home Access: Stairs to enter Entergy CorporationEntrance Stairs-Number of Steps: 3 Entrance Stairs-Rails: None Home Layout: Two level;Able to live on main level with bedroom/bathroom     Bathroom Shower/Tub: Tub/shower unit;Curtain Shower/tub  characteristics: Curtain Teacher, early years/preBathroom Toilet: Standard Bathroom Accessibility: Yes How Accessible: Accessible via walker (by going sideways) Home Equipment: None          Prior Functioning/Environment Level of Independence: Independent        Comments: drives/ martial arts        OT Problem List: Decreased strength;Decreased range of motion;Impaired balance (sitting and/or standing);Decreased safety awareness;Decreased knowledge of use of DME  or AE;Pain   OT Treatment/Interventions: Self-care/ADL training;DME and/or AE instruction;Therapeutic activities;Patient/family education    OT Goals(Current goals can be found in the care plan section) Acute Rehab OT Goals Patient Stated Goal: Back to being independent and return to martial arts OT Goal Formulation: With patient Time For Goal Achievement: 05/13/16 Potential to Achieve Goals: Good  OT Frequency: Min 2X/week   Barriers to D/C:            Co-evaluation              End of Session Equipment Utilized During Treatment: Gait belt;Rolling walker Nurse Communication: Mobility status;Weight bearing status  Activity Tolerance: Patient tolerated treatment well Patient left: in chair;with call bell/phone within reach;with family/visitor present   Time: [id number] OT Time Calculation (min): 25 min Charges:  OT General Charges $OT Visit: 1 Procedure OT Evaluation $OT Eval Moderate Complexity: 1 Procedure OT Treatments $Self Care/Home Management : 8-22 mins G-Codes:    Adithi Gammon,HILLARY 05/06/2016, 12:31 PM   [facility] Montgomery[facility]ary Bomani Oommen, OTR/L  [phone] 05/06/2016

## 2016-05-06 NOTE — Care Management Note (Signed)
Case Management Note  Patient Details  Name: Marisa CyphersRichard Spoerl MRN: [id number] Date of Birth: 04/06/1966  Subjective/Objective:      Right THA              Action/Plan: Discharge Planning: [facility]. Pt requesting RW and 3n1 for home. Contacted [facility] for HHPT. Spoke to [employer] Corporationoncall RN and requested orders faxed to # [phone]. Contacted [facility] DME rep for equipment to be delivered to room prior to dc.    Expected Discharge Date:                  Expected Discharge Plan:  Home w Home Health Services  In-House Referral:  NA  Discharge planning Services  CM Consult  Post Acute Care Choice:  Home Health Choice offered to:  Patient  DME Arranged:  3-N-1, Walker rolling DME Agency:  [facility]  HH Arranged:  PT HH Agency[facility]  [facility]  Status of Service:  In process, will continue to follow  If discussed at Long Length of Stay Meetings, dates discussed:    Additional Comments:  Elliot CousinShavis, Brandley Aldrete Ellen, RN 05/06/2016, 3:31 PM

## 2016-05-07 MED ORDER — METHOCARBAMOL 500 MG PO TABS: 500.0000 mg | ORAL_TABLET | Freq: Four times a day (QID) | ORAL | 0 refills | Status: AC | PRN

## 2016-05-07 MED ORDER — OXYCODONE-ACETAMINOPHEN 5-325 MG PO TABS: 1.0000 | ORAL_TABLET | ORAL | 0 refills | Status: AC | PRN

## 2016-05-07 MED ORDER — ASPIRIN 81 MG PO CHEW: 81.0000 mg | CHEWABLE_TABLET | Freq: Two times a day (BID) | ORAL | 0 refills | Status: AC

## 2016-05-07 NOTE — Discharge Summary (Signed)
Physician Discharge Summary  Patient ID: Ronald CyphersRichard Kulik MRN: [id number] DOB/AGE: September 28, 1965 50 y.o.  Admit date: 05/05/2016 Discharge date: 05/07/2016  Admission Diagnoses:  Unilateral primary osteoarthritis, right hip  Discharge Diagnoses:  Principal Problem:   Unilateral primary osteoarthritis, right hip Active Problems:   Status post total replacement of right hip   Past Medical History:  Diagnosis Date  . GERD (gastroesophageal reflux disease)    ocassionally    Surgeries: Procedure(s): RIGHT TOTAL HIP ARTHROPLASTY ANTERIOR APPROACH on 05/05/2016   Consultants (if any):   Discharged Condition: Improved  Hospital Course: Ronald CyphersRichard Coiro is an 50 y.o. male who was admitted 05/05/2016 with a diagnosis of Unilateral primary osteoarthritis, right hip and went to the operating room on 05/05/2016 and underwent the above named procedures.    He was given perioperative antibiotics:  Anti-infectives    Start     Dose/Rate Route Frequency Ordered Stop   05/05/16 2100  clindamycin (CLEOCIN) IVPB 600 mg     600 mg 100 mL/hr over 30 Minutes Intravenous Every 6 hours 05/05/16 2004 05/06/16 0259   05/05/16 1130  clindamycin (CLEOCIN) IVPB 900 mg     900 mg 100 mL/hr over 30 Minutes Intravenous To ShortStay Surgical 05/04/16 1347 05/05/16 1400    .  He was given sequential compression devices, early ambulation, and aspirin for DVT prophylaxis.  He benefited maximally from the hospital stay and there were no complications.    Recent vital signs:  Vitals:   05/06/16 2010 05/07/16 0545  BP: 139/73 134/67  Pulse: (!) 105 (!) 105  Resp: 18 16  Temp: 98.9 F (37.2 C) 98.5 F (36.9 C)    Recent laboratory studies:  Lab Results  Component Value Date   HGB 9.3 (L) 05/06/2016   HGB 14.8 04/28/2016   Lab Results  Component Value Date   WBC 8.6 05/06/2016   PLT 192 05/06/2016   No results found for: INR Lab Results  Component Value Date   NA 136 05/06/2016   K 3.8  05/06/2016   CL 102 05/06/2016   CO2 29 05/06/2016   BUN 9 05/06/2016   CREATININE 0.90 05/06/2016   GLUCOSE 127 (H) 05/06/2016    Discharge Medications:     Medication List    TAKE these medications   aspirin 81 MG chewable tablet Chew 1 tablet (81 mg total) by mouth 2 (two) times daily.   EMERGEN-C IMMUNE PLUS Pack Take 1 packet by mouth daily as needed (for immune health/cold symptoms.).   ibuprofen 200 MG tablet Commonly known as:  ADVIL,MOTRIN Take 200-400 mg by mouth every 8 (eight) hours as needed (for pain.).   methocarbamol 500 MG tablet Commonly known as:  ROBAXIN Take 1 tablet (500 mg total) by mouth every 6 (six) hours as needed for muscle spasms.   OVER THE COUNTER MEDICATION Take 1 capsule by mouth daily as needed (before exercise regime). Pre-workout supplements   oxyCODONE-acetaminophen 5-325 MG tablet Commonly known as:  ROXICET Take 1-2 tablets by mouth every 4 (four) hours as needed.   PROTEIN PO Take 1 scoop by mouth 2 (two) times daily as needed (following exercise regime). CARNIVOR pure Beef Protein Isolate            Durable Medical Equipment        Start     Ordered   05/06/16 1519  For home use only DME 3 n 1  Once     05/06/16 1519   05/05/16 2005  DME Walker rolling  Once    Question:  Patient needs a walker to treat with the following condition  Answer:  Status post total replacement of right hip   05/05/16 2004      Diagnostic Studies: Dg C-arm 1-60 Min  Result Date: 05/05/2016 CLINICAL DATA:  Status post total hip replacement EXAM: DG C-ARM 61-120 MIN; OPERATIVE RIGHT HIP COMPARISON:  None. FLUOROSCOPY TIME:  Fluoroscopy Time:  0 minutes 55 seconds Number of Acquired Images:  2 FINDINGS: Frontal view obtained of the right hip region reveals a total hip replacement with prosthetic components appearing well-seated. No acute fracture or dislocation evident. Visualized left hip joint appears unremarkable. IMPRESSION: Total hip  replacement on the right with prosthetic components appearing well-seated. No acute fracture or dislocation. Electronically Signed   By: Bretta BangWilliam  Woodruff III M.D.   On: 05/05/2016 15:55   Dg Hip Port Unilat With Pelvis 1v Right  Result Date: 05/05/2016 CLINICAL DATA:  Status post right total hip replacement today. EXAM: DG HIP (WITH OR WITHOUT PELVIS) 1V PORT RIGHT COMPARISON:  Intraoperative imaging this same day. FINDINGS: Right total hip arthroplasty is identified. The device is located and there is no fracture. Gas in the soft tissues from surgery is noted. IMPRESSION: Right total hip replacement.  No acute finding. Electronically Signed   By: Drusilla Kannerhomas  Dalessio M.D.   On: 05/05/2016 19:47   Dg Hip Operative Unilat W Or W/o Pelvis Right  Result Date: 05/05/2016 CLINICAL DATA:  Status post total hip replacement EXAM: DG C-ARM 61-120 MIN; OPERATIVE RIGHT HIP COMPARISON:  None. FLUOROSCOPY TIME:  Fluoroscopy Time:  0 minutes 55 seconds Number of Acquired Images:  2 FINDINGS: Frontal view obtained of the right hip region reveals a total hip replacement with prosthetic components appearing well-seated. No acute fracture or dislocation evident. Visualized left hip joint appears unremarkable. IMPRESSION: Total hip replacement on the right with prosthetic components appearing well-seated. No acute fracture or dislocation. Electronically Signed   By: Bretta BangWilliam  Woodruff III M.D.   On: 05/05/2016 15:55    Disposition: to home  Discharge Instructions    Call MD / Call 911    Complete by:  As directed    If you experience chest pain or shortness of breath, CALL 911 and be transported to the hospital emergency room.  If you develope a fever above 101 F, pus (white drainage) or increased drainage or redness at the wound, or calf pain, call your surgeon's office.   Constipation Prevention    Complete by:  As directed    Drink plenty of fluids.  Prune juice may be helpful.  You may use a stool softener, such  as Colace (over the counter) 100 mg twice a day.  Use MiraLax (over the counter) for constipation as needed.   Diet - low sodium heart healthy    Complete by:  As directed    Discharge patient    Complete by:  As directed    Increase activity slowly as tolerated    Complete by:  As directed       Follow-up Information    [facility] Follow up.   Specialty:  Home Health Services Why:  Home Health Physical Therapy Contact information: [address] [place] [place] [address] [phone]        Kathryne Hitchhristopher Y Jawaan Adachi, MD Follow up in 2 week(s).   Specialty:  Orthopedic Surgery Contact information: [address] [place] [address] [phone]  Signed: Kathryne Hitch 05/07/2016, 8:05 AM

## 2016-05-07 NOTE — Discharge Instructions (Signed)

## 2016-05-07 NOTE — Progress Notes (Signed)
Subjective: 2 Days Post-Op Procedure(s) (LRB): RIGHT TOTAL HIP ARTHROPLASTY ANTERIOR APPROACH (Right) Patient reports pain as moderate.  Vitals stable.  Making progress with therapy.  Objective: Vital signs in last 24 hours: Temp:  [98.5 F (36.9 C)-99.7 F (37.6 C)] 98.5 F (36.9 C) (11/23 0545) Pulse Rate:  [96-105] 105 (11/23 0545) Resp:  [16-18] 16 (11/23 0545) BP: (134-142)/(65-73) 134/67 (11/23 0545) SpO2:  [96 %-100 %] 96 % (11/23 0545)  Intake/Output from previous day: 11/22 0701 - 11/23 0700 In: 240 [P.O.:240] Out: 1950 [Urine:1950] Intake/Output this shift: No intake/output data recorded.   Recent Labs  05/06/16 0706  HGB 9.3*    Recent Labs  05/06/16 0706  WBC 8.6  RBC 3.08*  HCT 27.9*  PLT 192    Recent Labs  05/06/16 0706  NA 136  K 3.8  CL 102  CO2 29  BUN 9  CREATININE 0.90  GLUCOSE 127*  CALCIUM 8.2*   No results for input(s): LABPT, INR in the last 72 hours.  Sensation intact distally Intact pulses distally Dorsiflexion/Plantar flexion intact Incision: dressing C/D/I  Assessment/Plan: 2 Days Post-Op Procedure(s) (LRB): RIGHT TOTAL HIP ARTHROPLASTY ANTERIOR APPROACH (Right) Up with therapy Plan for discharge tomorrow Discharge home with home health  [name] 05/07/2016, 7:57 AM

## 2016-05-07 NOTE — Progress Notes (Signed)
Physical Therapy Treatment Patient Details Name: Ronald CyphersRichard Kerchner MRN: [id number] DOB: [birth year] Today's Date: 05/07/2016    History of Present Illness 50 y.o. male [facility] for elective R direct anterior THA.  Pt with no significant PMHx .      PT Comments    Patient is making good progress with PT.  From a mobility standpoint anticipate patient will be ready for DC home when medically ready.     Follow Up Recommendations  Home health PT;Supervision for mobility/OOB     Equipment Recommendations  3in1 (PT)    Recommendations for Other Services       Precautions / Restrictions Precautions Precautions: Fall Restrictions Weight Bearing Restrictions: Yes RLE Weight Bearing: Weight bearing as tolerated    Mobility  Bed Mobility               General bed mobility comments: pt OOB in chair upon arrival  Transfers Overall transfer level: Needs assistance Equipment used: Rolling walker (2 wheeled) Transfers: Sit to/from Stand Sit to Stand: Supervision Stand pivot transfers: Supervision       General transfer comment: supervision for safety; good technique  Ambulation/Gait Ambulation/Gait assistance: Supervision Ambulation Distance (Feet): 200 Feet Assistive device: Rolling walker (2 wheeled) Gait Pattern/deviations: Step-through pattern Gait velocity: decreased   General Gait Details: steady gait with improved posture and step length symmetry this session   Stairs Stairs: Yes Stairs assistance: Min guard Stair Management: No rails;Backwards;With walker Number of Stairs: 3 General stair comments: cues for sequencing and technqiue and handout given; wife present and stabilized RW; min guard for safety but no physical assist required  Wheelchair Mobility    Modified Rankin (Stroke Patients Only)       Balance   Sitting-balance support: Feet supported Sitting balance-Leahy Scale: Good     Standing balance support: During  functional activity Standing balance-Leahy Scale: Fair                      Cognition Arousal/Alertness: Awake/alert Behavior During Therapy: WFL for tasks assessed/performed Overall Cognitive Status: Within Functional Limits for tasks assessed                      Exercises Total Joint Exercises Hip ABduction/ADduction: AROM;Right;10 reps;Standing Knee Flexion: AROM;Right;10 reps;Standing Marching in Standing: AROM;Right;10 reps;Standing    General Comments        Pertinent Vitals/Pain Pain Assessment: 0-10 Pain Score: 5  Pain Location: R hip with mobility Pain Descriptors / Indicators: Aching;Grimacing;Guarding;Sore Pain Intervention(s): Limited activity within patient's tolerance;Monitored during session;Premedicated before session;Repositioned;Ice applied    Home Living                      Prior Function            PT Goals (current goals can now be found in the care plan section) Acute Rehab PT Goals Patient Stated Goal: Back to being independent and return to martial arts PT Goal Formulation: With patient Time For Goal Achievement: 05/13/16 Potential to Achieve Goals: Good Progress towards PT goals: Progressing toward goals    Frequency    7X/week      PT Plan Current plan remains appropriate    Co-evaluation             End of Session Equipment Utilized During Treatment: Gait belt Activity Tolerance: Patient tolerated treatment well Patient left: in chair;with call bell/phone within reach;with family/visitor present  Time: 1206-1233 PT Time Calculation (min) (ACUTE ONLY): 27 min  Charges:  $Gait Training: 8-22 mins $Therapeutic Exercise: 8-22 mins                    G Codes:      Derek MoundKellyn R Ashlin Kreps Georjean Toya, PTA Pager: [phone]   05/07/2016, 1:08 PM

## 2016-05-07 NOTE — Progress Notes (Signed)
Occupational Therapy Treatment Patient Details Name: Marisa CyphersRichard Ortloff MRN: [id number] DOB: 1966/04/26 Today's Date: 05/07/2016    History of present illness 50 y.o. male admitted to [facility] on 05/05/16 for elective R direct anterior THA.  Pt with no significant PMHx .     OT comments  Pt is able to perform tub transfer with supervision.  He is eager to discharge home.   Follow Up Recommendations  No OT follow up;Supervision - Intermittent    Equipment Recommendations  3 in 1 bedside comode    Recommendations for Other Services      Precautions / Restrictions Precautions Precautions: Fall Restrictions Weight Bearing Restrictions: Yes RLE Weight Bearing: Weight bearing as tolerated       Mobility Bed Mobility                  Transfers Overall transfer level: Needs assistance Equipment used: Rolling walker (2 wheeled) Transfers: Sit to/from Stand;Stand Pivot Transfers Sit to Stand: Supervision Stand pivot transfers: Supervision       General transfer comment: Pt demonstrates good safety awareness     Balance   Sitting-balance support: Feet supported Sitting balance-Leahy Scale: Good     Standing balance support: During functional activity Standing balance-Leahy Scale: Fair                     ADL Overall ADL's : Needs assistance/impaired                                 Tub/ Shower Transfer: Supervision/safety;Rolling walker;3 in 1 Tub/Shower Transfer Details (indicate cue type and reason): Pt instructed in safe tub transfer using 3in1 commode  Functional mobility during ADLs: Supervision/safety;Rolling walker General ADL Comments: Pt encouraged to attempt LB ADLs daily  to increase ROM of hip      Vision                     Perception     Praxis      Cognition   Behavior During Therapy: WFL for tasks assessed/performed Overall Cognitive Status: Within Functional Limits for tasks assessed                        Extremity/Trunk Assessment               Exercises     Shoulder Instructions       General Comments      Pertinent Vitals/ Pain       Pain Assessment: 0-10 Pain Score: 6  Pain Location: rt hip  Pain Descriptors / Indicators: Operative site guarding Pain Intervention(s): Monitored during session;Repositioned  Home Living                                          Prior Functioning/Environment              Frequency           Progress Toward Goals  OT Goals(current goals can now be found in the care plan section)  Progress towards OT goals: Progressing toward goals     Plan Discharge plan remains appropriate    Co-evaluation                 End of Session Equipment Utilized During Treatment: Rolling walker  Activity Tolerance Patient tolerated treatment well   Patient Left in chair;with call bell/phone within reach   Nurse Communication Mobility status        Time: [id number] OT Time Calculation (min): 18 min  Charges: OT General Charges $OT Visit: 1 Procedure OT Treatments $Self Care/Home Management : 8-22 mins  Lucylle Foulkes M 05/07/2016, 1:02 PM

## 2016-05-07 NOTE — Progress Notes (Signed)
Pt discharged to home via wheelchair per MD order without incident accompanied by spouse. Prior to discharge, all discharge teachings done both written and verbal and pt and spouse both verbalize understanding and agree to comply. No change in pt from AM assessment. Pt pain "tolerable" upon discharge.

## 2016-05-07 NOTE — Progress Notes (Signed)
Patient ID: Ronald CyphersRichard Somero, male   DOB: June 29, 1965, 50 y.o.   MRN: [id number] Doing well enough to go home today.

## 2016-05-11 ENCOUNTER — Telehealth (INDEPENDENT_AMBULATORY_CARE_PROVIDER_SITE_OTHER): Payer: Self-pay | Admitting: *Deleted

## 2016-05-11 NOTE — Telephone Encounter (Signed)
Verbal order given  

## 2016-05-11 NOTE — Telephone Encounter (Signed)
Mark Bias PT for [facility] needing VO for PT 3wks/1, 2 wks/1 starting Nov 26, states can leave message on vm stating ok for PT.   Thanks

## 2016-05-13 ENCOUNTER — Telehealth (INDEPENDENT_AMBULATORY_CARE_PROVIDER_SITE_OTHER): Payer: Self-pay

## 2016-05-13 NOTE — Telephone Encounter (Signed)
Patient had surgery 05/05/16 RIGHT THA. He states his  back is breaking out and now its moving into his stomach. Last dose of Oxy was Sunday, last dose of Robaxin was Monday. He is currently taking ES Tylenol and Aspirin wants to know what he can do or is this normal. Please advise. Thanks.   CB # [phone]

## 2016-05-13 NOTE — Telephone Encounter (Signed)
Likely normal from the meds and from being on his back for a while in the hospital.  Would also try benadryl and hydrocortisone cream over-the-counter

## 2016-05-13 NOTE — Telephone Encounter (Signed)
Please advise 

## 2016-05-13 NOTE — Telephone Encounter (Signed)
Patient aware of the below message  

## 2016-05-18 ENCOUNTER — Encounter (INDEPENDENT_AMBULATORY_CARE_PROVIDER_SITE_OTHER): Payer: Self-pay

## 2016-05-18 ENCOUNTER — Ambulatory Visit (INDEPENDENT_AMBULATORY_CARE_PROVIDER_SITE_OTHER): Payer: BLUE CROSS/BLUE SHIELD | Admitting: Orthopaedic Surgery

## 2016-05-18 DIAGNOSIS — Z96641 Presence of right artificial hip joint: Secondary | ICD-10-CM

## 2016-05-18 NOTE — Progress Notes (Signed)
The patient is doing great 2 weeks tomorrow status post a right total hip arthroplasty through direct anterior approach. He'll all questions for me today and we answered all those. His incision looks good. There is no significant seroma at all. I removed the Steri-Strips Steri-Strips and placed knee Steri-Strips. His leg lengths are equal as well. He tolerates me putting his right hip the range of motion.  At this point he'll continue increase his activities. He can drive. He can get rid of his cane when he is comfortable. He will still avoid lower body working out or running. I'll reevaluate him in 4 weeks. No x-rays are needed.

## 2016-06-22 ENCOUNTER — Ambulatory Visit (INDEPENDENT_AMBULATORY_CARE_PROVIDER_SITE_OTHER): Payer: BLUE CROSS/BLUE SHIELD | Admitting: Orthopaedic Surgery

## 2016-06-22 DIAGNOSIS — Z96641 Presence of right artificial hip joint: Secondary | ICD-10-CM

## 2016-06-22 NOTE — Progress Notes (Signed)
The patient is now 6 weeks status post a right total hip arthroplasty. He is doing well. 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 He is walking without assistive device. He's not as fatigue as he was before and he feels like things are improving significantly. He says he is not in the pain that he was preoperative.  On examination his leg lengths are equal. He tolerates good hip flexion extension and rotation.  This point a continued increase his activities. I want him to hold off on high-impact aerobic activities for at least another 2-4 weeks. He can get back to his squats and other activities as comfort allows. I'll see him back in 4 weeks to see how is doing overall but no x-rays are needed.

## 2016-07-20 ENCOUNTER — Encounter (INDEPENDENT_AMBULATORY_CARE_PROVIDER_SITE_OTHER): Payer: Self-pay | Admitting: Orthopaedic Surgery

## 2016-07-20 ENCOUNTER — Ambulatory Visit (INDEPENDENT_AMBULATORY_CARE_PROVIDER_SITE_OTHER): Payer: BLUE CROSS/BLUE SHIELD | Admitting: Orthopaedic Surgery

## 2016-07-20 DIAGNOSIS — Z96641 Presence of right artificial hip joint: Secondary | ICD-10-CM

## 2016-07-20 NOTE — Progress Notes (Signed)
He is doing great now 2 and half months status post a right total hip replacement. 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 He will also get back into kicking more he said is been slowly increasing activities. He has really no complaints.  On exam his right hip moves fluidly his leg lengths are equal.  At this point a continued increase his activities as comfort allows. I want him to still go slow with some of the kicking activities and he understands this. I don't really need to see him back for 6 months. I would like an low AP pelvis and a lateral of his right operative hip at that visit.

## 2017-02-08 ENCOUNTER — Ambulatory Visit (INDEPENDENT_AMBULATORY_CARE_PROVIDER_SITE_OTHER): Payer: Self-pay

## 2017-02-08 ENCOUNTER — Ambulatory Visit (INDEPENDENT_AMBULATORY_CARE_PROVIDER_SITE_OTHER): Payer: BLUE CROSS/BLUE SHIELD | Admitting: Orthopaedic Surgery

## 2017-02-08 ENCOUNTER — Encounter (INDEPENDENT_AMBULATORY_CARE_PROVIDER_SITE_OTHER): Payer: Self-pay | Admitting: Orthopaedic Surgery

## 2017-02-08 DIAGNOSIS — M7541 Impingement syndrome of right shoulder: Secondary | ICD-10-CM | POA: Diagnosis not present

## 2017-02-08 DIAGNOSIS — M25511 Pain in right shoulder: Secondary | ICD-10-CM

## 2017-02-08 DIAGNOSIS — Z96641 Presence of right artificial hip joint: Secondary | ICD-10-CM | POA: Diagnosis not present

## 2017-02-08 MED ORDER — METHYLPREDNISOLONE ACETATE 40 MG/ML IJ SUSP
40.0000 mg | INTRAMUSCULAR | Status: AC | PRN
  Administered 2017-02-08: 40 mg via INTRA_ARTICULAR

## 2017-02-08 MED ORDER — LIDOCAINE HCL 1 % IJ SOLN
3.0000 mL | INTRAMUSCULAR | Status: AC | PRN
  Administered 2017-02-08: 3 mL

## 2017-02-08 NOTE — Progress Notes (Signed)
Office Visit Note   Patient: Ronald Spears           Date of Birth: Sep 01, 1965           MRN: [id number] Visit Date: 02/08/2017              Requested by: [place] [address]  [address] [place], [place] [address] PCP: Ignacia Palma., MD   Assessment & Plan: Visit Diagnoses:  1. Status post total replacement of right hip   2. Acute pain of right shoulder   3. Impingement syndrome of right shoulder     Plan: We spoke about activity modification for the right shoulder. He is willing to try subacromial injection of a steroid The risk and benefits of this to him as well. 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 As far as his hip goes he'll follow-up as needed and we talked about things and would need to bring him back for that hip at any point. All questions were encouraged and answered.  Follow-Up Instructions: Return if symptoms worsen or fail to improve.   Orders:  Orders Placed This Encounter  Procedures  . Large Joint Injection/Arthrocentesis  . XR HIP UNILAT W OR W/O PELVIS 2-3 VIEWS RIGHT  . XR Shoulder Right   No orders of the defined types were placed in this encounter.     Procedures: Large Joint Inj Date/Time: 02/08/2017 10:19 AM Performed by: Kathryne Hitch Authorized by: Kathryne Hitch   Location:  Shoulder Site:  R subacromial bursa Ultrasound Guidance: No   Fluoroscopic Guidance: No   Arthrogram: No   Medications:  3 mL lidocaine 1 %; 40 mg methylPREDNISolone acetate 40 MG/ML     Clinical Data: No additional findings.   Subjective: Chief Complaint  Patient presents with  . Follow-up    05/05/16 RT THA  . Shoulder Pain    right shoulder pain  The patient is well-known to me. He is getting close to a year out from a total hip arthroplasty. This was done in November of last year. He says the hip  doesn't give him any problems at all on the right hip. He does only see him for his shoulder today though. He is right-hand dominant he's been having shoulder pain that is waking up at night and hurts with overhead activities and reaching behind him with no known injury. 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 It only hurts in the shoulder he denies any neck pain on the right side and denies any numbness and tingling in the right hand.  HPI  Review of Systems He currently denies any headache, chest pain, shortness of breath, fever, chills, nausea, vomiting.  Objective: Vital Signs: There were no vitals taken for this visit.  Physical Exam He is alert and oriented 3 and in no acute distress Ortho Exam  Examination of his right operative hip shows full active and passive range of motion with no pain at all. His leg lengths are equal. His neurovascular intact. Examination of his right shoulder shows positive Neer and Hawkins signs which only mild. His main pain is at the acromioclavicular joint. His range of motion is full. His liftoff is negative. He has 5 out of 5 strength with abduction and external rotation as  well. Specialty Comments:  No specialty comments available.  Imaging: Xr Hip Unilat W Or W/o Pelvis 2-3 Views Right  Result Date: 02/08/2017 An AP pelvis and lateral of his right hip shows a well-seated total hip arthroplasty with no complicating features. There is no evidence of loosening. The alignment is well maintained.  Xr Shoulder Right  Result Date: 02/08/2017 3 views of his right shoulder show well located shoulder with no significant arthritic changes at the glenohumeral joint. There is moderate acromioclavicular arthritis. Otherwise no acute findings in the subacromial outlet is wide open.    PMFS History: Patient Active Problem List   Diagnosis Date Noted  . Unilateral  primary osteoarthritis, right hip 05/05/2016  . Status post total replacement of right hip 05/05/2016   Past Medical History:  Diagnosis Date  . GERD (gastroesophageal reflux disease)    ocassionally    No family history on file.  Past Surgical History:  Procedure Laterality Date  . TOTAL HIP ARTHROPLASTY Right 05/05/2016   Procedure: RIGHT TOTAL HIP ARTHROPLASTY ANTERIOR APPROACH;  Surgeon: Kathryne Hitch, MD;  Location: [facility] OR;  Service: Orthopedics;  Laterality: Right;   Social History   Occupational History  . Not on file.   Social History Main Topics  . Smoking status: Never Smoker  . Smokeless tobacco: Never Used  . Alcohol use No  . Drug use: No  . Sexual activity: Not on file

## 2017-04-21 ENCOUNTER — Telehealth (INDEPENDENT_AMBULATORY_CARE_PROVIDER_SITE_OTHER): Payer: Self-pay | Admitting: Orthopaedic Surgery

## 2017-04-21 NOTE — Telephone Encounter (Signed)
Patient called saying that he is experiencing pain in his right hip and was wanting to speak to Dr. Magnus IvanBlackman. CB # [phone]

## 2017-04-22 NOTE — Telephone Encounter (Signed)
I would have him rest completely from kicking for 2 weeks and take NSAIDs as needed.  Could have tweeked a muscle.

## 2017-04-22 NOTE — Telephone Encounter (Signed)
Patient states he "threw a kick" in jujitsu and has had a lot of pain since, I told him it would be nice to see him and xray but he said he would really like to talk with you and see if this something he can do at home, maybe just a "muscle"?

## 2017-04-22 NOTE — Telephone Encounter (Signed)
Patient aware of the below message  

## 2018-06-30 IMAGING — CR DG HIP (WITH OR WITHOUT PELVIS) 1V PORT*R*
4 series · 5 of 5 positions shown · non-contrast
Comparison: Intraoperative imaging this same day.

CLINICAL DATA: Status post right total hip replacement today.

EXAM:
DG HIP (WITH OR WITHOUT PELVIS) 1V PORT RIGHT

[Series 2: ap · 0.17mm/px · 2 of 2 slices shown (1 of 4)]
[im 1/2]
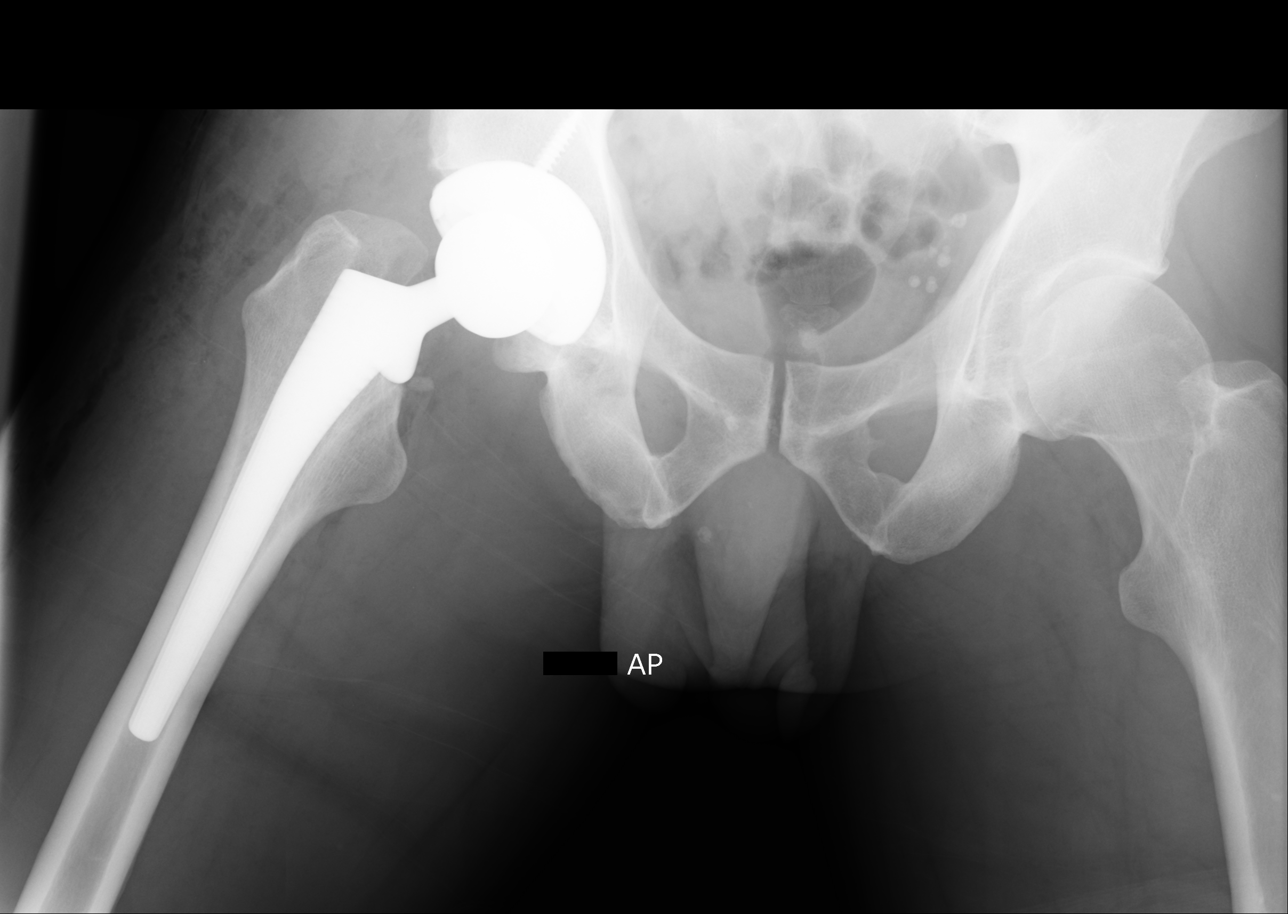
[im 2/2]
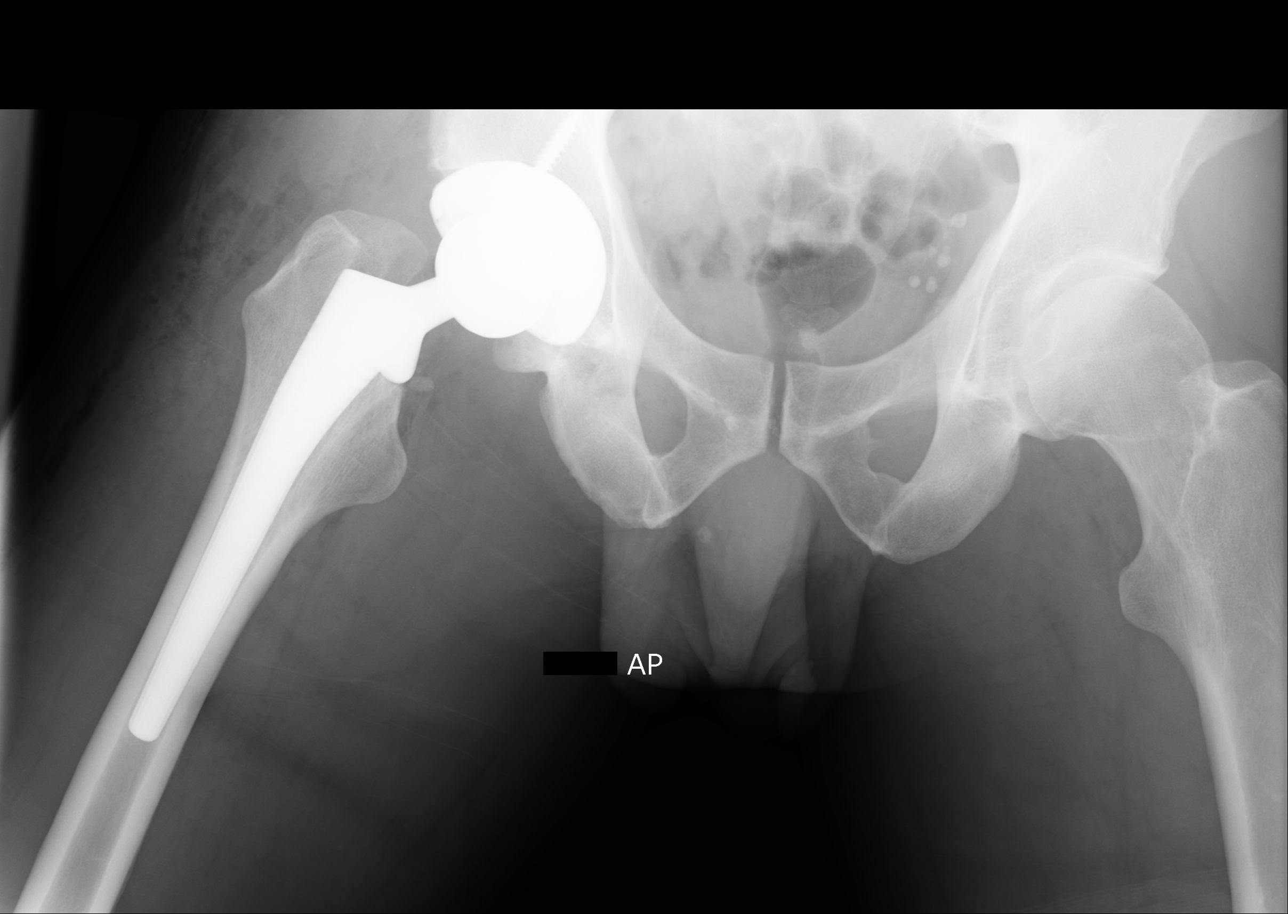

[ap (2 of 4)]
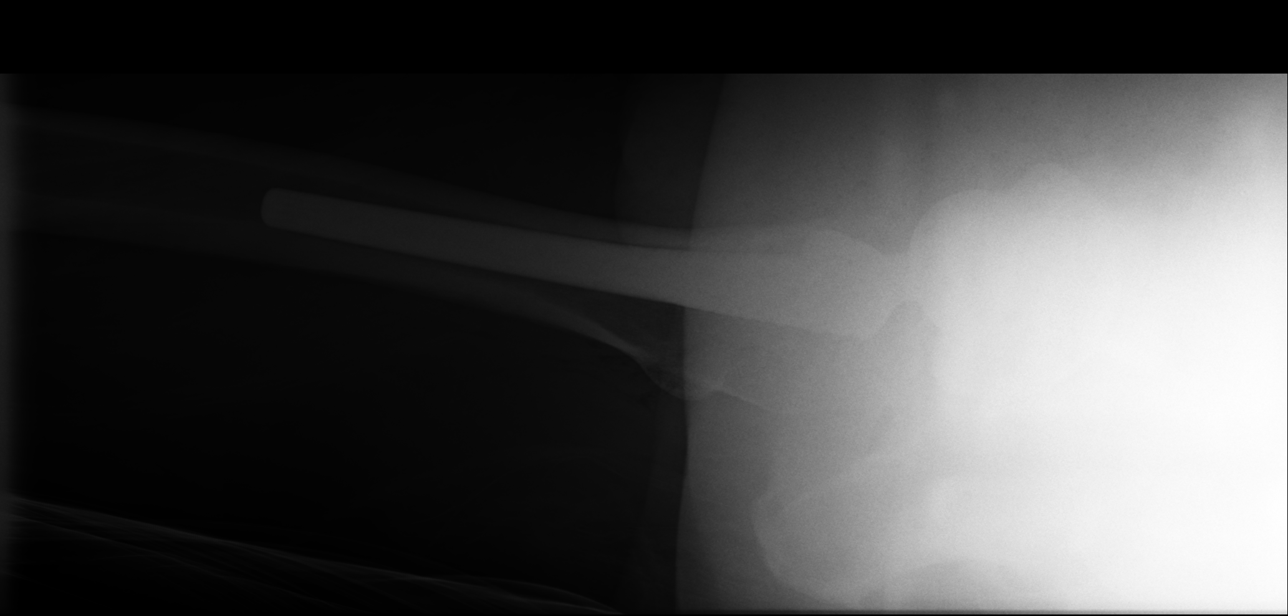

[ap (3 of 4)]
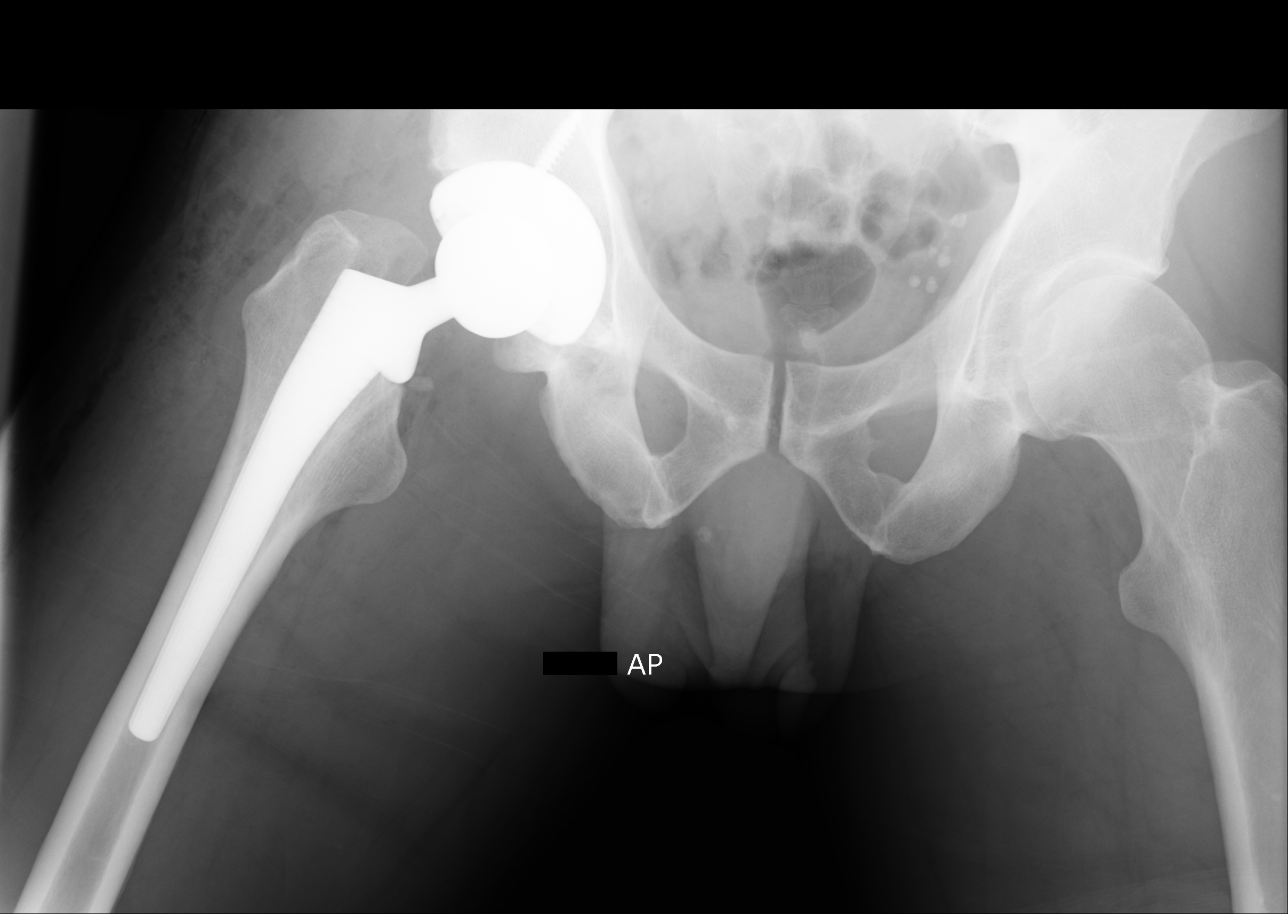

[ap (4 of 4)]
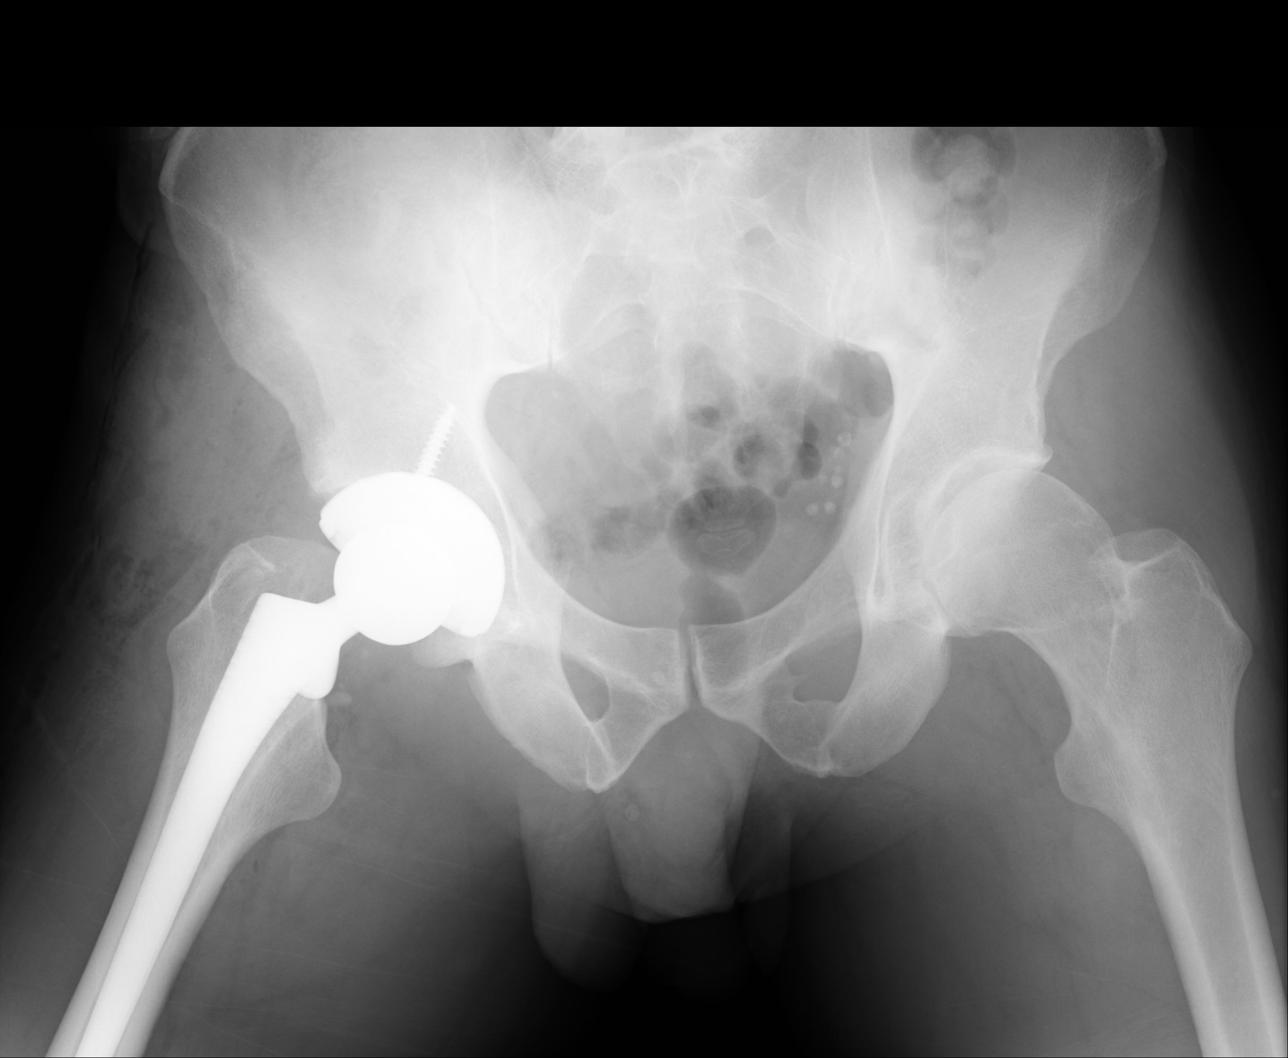

[5 of 5 positions shown; findings below may reference images not displayed]

FINDINGS: Right total hip arthroplasty is identified. The device is located
and there is no fracture. Gas in the soft tissues from surgery is
noted.
IMPRESSION: Right total hip replacement.  No acute finding.
